# Patient Record
Sex: Female | Born: 1995 | Race: White | Hispanic: No | Marital: Single | Smoking: Never smoker
Health system: Southern US, Community
[De-identification: ages and names within clinical notes are randomized; demographics above are authoritative.]

## PROBLEM LIST (undated history)

## (undated) DIAGNOSIS — B2799 Infectious mononucleosis, unspecified with other complication: Secondary | ICD-10-CM

## (undated) DIAGNOSIS — B178 Other specified acute viral hepatitis: Secondary | ICD-10-CM

## (undated) DIAGNOSIS — B2790 Infectious mononucleosis, unspecified without complication: Secondary | ICD-10-CM

## (undated) DIAGNOSIS — L709 Acne, unspecified: Secondary | ICD-10-CM

## (undated) DIAGNOSIS — R0689 Other abnormalities of breathing: Secondary | ICD-10-CM

## (undated) DIAGNOSIS — J45990 Exercise induced bronchospasm: Secondary | ICD-10-CM

## (undated) HISTORY — PX: MYRINGOTOMY WITH TUBE PLACEMENT: SHX5663

## (undated) HISTORY — DX: Exercise induced bronchospasm: J45.990

## (undated) HISTORY — DX: Infectious mononucleosis, unspecified without complication: B27.90

## (undated) HISTORY — PX: CHOANAL ADENIODECTOMY: SHX1340

## (undated) HISTORY — PX: TONSILLECTOMY: SUR1361

## (undated) HISTORY — DX: Other specified acute viral hepatitis: B17.8

## (undated) HISTORY — DX: Other abnormalities of breathing: R06.89

## (undated) HISTORY — DX: Infectious mononucleosis, unspecified with other complication: B27.99

---
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
## 2017-04-01 DIAGNOSIS — F411 Generalized anxiety disorder: Secondary | ICD-10-CM | POA: Diagnosis not present

## 2017-07-10 ENCOUNTER — Ambulatory Visit (INDEPENDENT_AMBULATORY_CARE_PROVIDER_SITE_OTHER): Payer: BLUE CROSS/BLUE SHIELD | Admitting: Adult Health

## 2017-07-10 ENCOUNTER — Encounter: Payer: Self-pay | Admitting: Adult Health

## 2017-07-10 VITALS — BP 106/72 | Temp 99.0°F | Wt 166.0 lb

## 2017-07-10 DIAGNOSIS — R000 Tachycardia, unspecified: Secondary | ICD-10-CM | POA: Diagnosis not present

## 2017-07-10 DIAGNOSIS — R5383 Other fatigue: Secondary | ICD-10-CM

## 2017-07-10 LAB — BASIC METABOLIC PANEL
BUN: 8 mg/dL (ref 6–23)
CALCIUM: 9.2 mg/dL (ref 8.4–10.5)
CO2: 28 meq/L (ref 19–32)
Chloride: 106 mEq/L (ref 96–112)
Creatinine, Ser: 0.73 mg/dL (ref 0.40–1.20)
GFR: 108.54 mL/min (ref >60.00)
Glucose, Bld: 98 mg/dL (ref 70–99)
Potassium: 4.2 mEq/L (ref 3.5–5.1)
SODIUM: 140 meq/L (ref 135–145)

## 2017-07-10 LAB — CBC WITH DIFFERENTIAL/PLATELET
BASOS ABS: 0 10*3/uL (ref 0.0–0.1)
Basophils Relative: 0.5 % (ref 0.0–3.0)
Eosinophils Absolute: 0 10*3/uL (ref 0.0–0.7)
Eosinophils Relative: 0.7 % (ref 0.0–5.0)
HEMATOCRIT: 41.8 % (ref 36.0–49.0)
HEMOGLOBIN: 14 g/dL (ref 12.0–16.0)
LYMPHS PCT: 34.4 % (ref 24.0–48.0)
Lymphs Abs: 1.8 10*3/uL (ref 0.7–4.0)
MCHC: 33.6 g/dL (ref 31.0–37.0)
MCV: 91.4 fl (ref 78.0–98.0)
MONOS PCT: 6.9 % (ref 3.0–12.0)
Monocytes Absolute: 0.4 10*3/uL (ref 0.1–1.0)
NEUTROS ABS: 3 10*3/uL (ref 1.4–7.7)
Neutrophils Relative %: 57.5 % (ref 43.0–71.0)
Platelets: 187 10*3/uL (ref 150.0–575.0)
RBC: 4.58 Mil/uL (ref 3.80–5.70)
RDW: 13 % (ref 11.4–15.5)
WBC: 5.2 10*3/uL (ref 4.5–13.5)

## 2017-07-10 LAB — IBC PANEL
Iron: 118 ug/dL (ref 42–145)
Saturation Ratios: 29.9 % (ref 20.0–50.0)
TRANSFERRIN: 282 mg/dL (ref 212.0–360.0)

## 2017-07-10 LAB — TSH: TSH: 1.36 u[IU]/mL (ref 0.40–5.00)

## 2017-07-10 NOTE — Patient Instructions (Signed)
It was great meeting you today!  I am going to check blood work to make sure you are not anemic and make sure there is nothing wrong with the thyroid   I will call you on Tuesday when I get the lab work back

## 2017-07-10 NOTE — Progress Notes (Signed)
Subjective:    Patient ID: Stacy Chan, female    DOB: [birth year], 21 y.o.   MRN: [id number]  HPI  21 year old female who  has a past medical history of Acne; Breathing difficult; Exercise-induced asthma; Infectious mononucleosis (12/02/2013); and Infectious mononucleosis hepatitis (12/02/2013).   She presents to the office today with the complaint of worsening fatigue, feeling of having a fast heart rate, and bruising  In mid June she started to notice that she was having episodes of a fast heart rate, she only noticed the fast heart rate when she was relaxing and in the evening. When she is busy carrying out her normal activities she does not notice any tachycardia. Throughout the summer she also noticed that she was more fatigued than she normally is and that she was bruising easier on her legs. The bruising was noticed by her friends which made her concerned.   She has stopped eating red meat   Denies depression, fevers, chest pain, SOB, or feeling ill.   She is leaving to go back to [place] in the morning for college    Review of Systems See HPI   Past Medical History:  Diagnosis Date  . Acne   . Breathing difficult    at birth;9lbs 4oz,21.5in was a csection baby  . Exercise-induced asthma    ?  helped with inhaler  no other sx  by hx  . Infectious mononucleosis 12/02/2013   Worsening mild splenomegaly not unexpected but tender   . Infectious mononucleosis hepatitis 12/02/2013    Social History   Social History  . Marital status: Single    Spouse name: N/A  . Number of children: N/A  . Years of education: N/A   Occupational History  . Not on file.   Social History Main Topics  . Smoking status: Never Smoker  . Smokeless tobacco: Never Used  . Alcohol use No  . Drug use: No  . Sexual activity: No   Other Topics Concern  . Not on file   Social History Narrative   No school concerns   hh [employer] 5    [employer]     Basketball track cross country   [employer].   Mission trip to [place] this summer    [facility] undeclared    Past Surgical History:  Procedure Laterality Date  . CHOANAL ADENIODECTOMY    . MYRINGOTOMY WITH TUBE PLACEMENT    . TONSILLECTOMY      Family History  Problem Relation Age of Onset  . Depression Other        family hx of  . Bipolar disorder Mother   . Anxiety disorder Brother   . ADD / ADHD Brother   . Depression Paternal Aunt   . Depression Paternal Uncle   . Anxiety disorder Maternal Grandfather   . Alcohol abuse Maternal Grandfather   . Depression Paternal Grandfather   . Thyroid disease Sister   . Thyroid disease Mother     No Known Allergies  No current outpatient prescriptions on file prior to visit.   No current facility-administered medications on file prior to visit.     BP 106/72 (BP Location: Left Arm)   Temp 99 F (37.2 C) (Oral)   Wt 166 lb (75.3 kg)   BMI 24.51 kg/m       Objective:   Physical Exam  Constitutional: She is oriented to person, place, and time. She appears well-developed and well-nourished. No  distress.  HENT:  Mouth/Throat: Uvula is midline, oropharynx is clear and moist and mucous membranes are normal. Mucous membranes are not pale, not dry and not cyanotic.  Cardiovascular: Normal rate, regular rhythm, normal heart sounds and intact distal pulses.  Exam reveals no gallop and no friction rub.   No murmur heard. Pulmonary/Chest: Effort normal and breath sounds normal. No respiratory distress. She has no wheezes. She has no rales. She exhibits no tenderness.  Neurological: She is alert and oriented to person, place, and time.  Skin: Skin is warm and dry. No rash noted. She is not diaphoretic. No erythema. No pallor.  Psychiatric: She has a normal mood and affect. Her behavior is normal. Judgment and thought content normal.  Nursing note and vitals reviewed.     Assessment & Plan:  1. Fatigue, unspecified type - Will check thyroid and blood counts for  anemia.  - Basic metabolic panel - CBC with Differential/Platelet - TSH - IBC Panel  2. Tachycardia - Basic metabolic panel - CBC with Differential/Platelet - TSH - IBC Panel  Shirline Frees, NP

## 2017-07-14 ENCOUNTER — Telehealth: Payer: Self-pay | Admitting: Internal Medicine

## 2017-07-14 NOTE — Telephone Encounter (Signed)
Pt would like blood work result  °

## 2017-07-15 NOTE — Telephone Encounter (Signed)
Left a VM for patient to give the office a call back regarding lab results.  

## 2018-04-10 ENCOUNTER — Other Ambulatory Visit: Payer: Self-pay

## 2018-04-10 ENCOUNTER — Ambulatory Visit (INDEPENDENT_AMBULATORY_CARE_PROVIDER_SITE_OTHER): Payer: BLUE CROSS/BLUE SHIELD | Admitting: Family Medicine

## 2018-04-10 ENCOUNTER — Encounter: Payer: Self-pay | Admitting: Family Medicine

## 2018-04-10 VITALS — BP 90/60 | HR 117 | Temp 99.9°F | Resp 14 | Ht 69.0 in | Wt 168.0 lb

## 2018-04-10 DIAGNOSIS — J069 Acute upper respiratory infection, unspecified: Secondary | ICD-10-CM

## 2018-04-10 DIAGNOSIS — J029 Acute pharyngitis, unspecified: Secondary | ICD-10-CM

## 2018-04-10 LAB — POCT RAPID STREP A (OFFICE): RAPID STREP A SCREEN: NEGATIVE

## 2018-04-10 MED ORDER — ONDANSETRON 4 MG PO TBDP: 4.0000 mg | ORAL_TABLET | Freq: Three times a day (TID) | ORAL | 0 refills | Status: AC | PRN

## 2018-04-10 NOTE — Progress Notes (Signed)
  Subjective:     Patient ID: Stacy Chan, female   DOB: Feb 20, 1996, 22 y.o.   MRN: [id number]  HPI   Patient seen with onset of illness about 4 days ago. He's had some sore throat, fever, chills, body aches, mild cough, nasal congestion.. She had one episode of vomiting this morning. No diarrhea. No sick contacts. Took Ibuprofen with mild relief. She's has had past history of mono. No recent tick bites.  Cough has been relatively mild. No dysuria and no abdominal pain.  Past Medical History:  Diagnosis Date  . Acne   . Breathing difficult    at birth;9lbs 4oz,21.5in was a csection baby  . Exercise-induced asthma    ?  helped with inhaler  no other sx  by hx  . Infectious mononucleosis 12/02/2013   Worsening mild splenomegaly not unexpected but tender   . Infectious mononucleosis hepatitis 12/02/2013   Past Surgical History:  Procedure Laterality Date  . CHOANAL ADENIODECTOMY    . MYRINGOTOMY WITH TUBE PLACEMENT    . TONSILLECTOMY      reports that she has never smoked. She has never used smokeless tobacco. She reports that she does not drink alcohol or use drugs. family history includes ADD / ADHD in her brother; Alcohol abuse in her maternal grandfather; Anxiety disorder in her brother and maternal grandfather; Bipolar disorder in her mother; Depression in her other, paternal aunt, paternal grandfather, and paternal uncle; Thyroid disease in her mother and sister. No Known Allergies   Review of Systems  Constitutional: Positive for chills and fatigue.  HENT: Positive for congestion and sore throat.   Respiratory: Positive for cough.   Gastrointestinal: Negative for abdominal pain.  Genitourinary: Negative for dysuria.       Objective:   Physical Exam  Constitutional: She appears well-developed and well-nourished.  HENT:  Right Ear: External ear normal.  Left Ear: External ear normal.  Mild posterior pharynx erythema. No exudate  Cardiovascular: Normal rate and regular  rhythm.  Pulmonary/Chest: Effort normal and breath sounds normal. No respiratory distress. She has no wheezes. She has no rales.  Skin: No rash noted.       Assessment:     Acute upper respiratory illness.  Rapid strep negative.    Plan:     -continue Ibuprofen or Tylenol as needed for fever, sore throat.   -stay well hydrated. -follow up with primary for any worsening or persistent symptoms.  Kristian Covey MD [facility]

## 2018-04-10 NOTE — Patient Instructions (Signed)

## 2018-04-12 ENCOUNTER — Ambulatory Visit (INDEPENDENT_AMBULATORY_CARE_PROVIDER_SITE_OTHER): Payer: BLUE CROSS/BLUE SHIELD | Admitting: Internal Medicine

## 2018-04-12 ENCOUNTER — Encounter: Payer: Self-pay | Admitting: Internal Medicine

## 2018-04-12 VITALS — BP 128/70 | HR 95 | Temp 98.5°F | Wt 166.5 lb

## 2018-04-12 DIAGNOSIS — J029 Acute pharyngitis, unspecified: Secondary | ICD-10-CM | POA: Diagnosis not present

## 2018-04-12 DIAGNOSIS — R509 Fever, unspecified: Secondary | ICD-10-CM | POA: Diagnosis not present

## 2018-04-12 LAB — CBC WITH DIFFERENTIAL/PLATELET
BASOS PCT: 0.2 % (ref 0.0–3.0)
Basophils Absolute: 0 10*3/uL (ref 0.0–0.1)
EOS ABS: 0 10*3/uL (ref 0.0–0.7)
EOS PCT: 0.1 % (ref 0.0–5.0)
HCT: 38.2 % (ref 36.0–46.0)
HEMOGLOBIN: 13.1 g/dL (ref 12.0–15.0)
LYMPHS ABS: 1.2 10*3/uL (ref 0.7–4.0)
Lymphocytes Relative: 23.3 % (ref 12.0–46.0)
MCHC: 34.2 g/dL (ref 30.0–36.0)
MCV: 91 fl (ref 78.0–100.0)
MONO ABS: 0.8 10*3/uL (ref 0.1–1.0)
Monocytes Relative: 16 % — ABNORMAL HIGH (ref 3.0–12.0)
NEUTROS PCT: 60.4 % (ref 43.0–77.0)
Neutro Abs: 3.1 10*3/uL (ref 1.4–7.7)
PLATELETS: 132 10*3/uL — AB (ref 150.0–400.0)
RBC: 4.2 Mil/uL (ref 3.87–5.11)
RDW: 12.9 % (ref 11.5–14.6)
WBC: 5.1 10*3/uL (ref 4.5–10.5)

## 2018-04-12 LAB — POCT RAPID STREP A (OFFICE): RAPID STREP A SCREEN: NEGATIVE

## 2018-04-12 MED ORDER — AMOXICILLIN 500 MG PO CAPS: 500.0000 mg | ORAL_CAPSULE | Freq: Two times a day (BID) | ORAL | 0 refills | Status: AC

## 2018-04-12 MED ORDER — VALACYCLOVIR HCL 1 G PO TABS: 1000.0000 mg | ORAL_TABLET | Freq: Two times a day (BID) | ORAL | 0 refills | Status: AC

## 2018-04-12 NOTE — Patient Instructions (Addendum)
This could be a viral  Infection such as the herpes simplex virus that causes cold sores.    BUtChecking for strep. At this time will treat for both pending    Test results   Force liquids toa void dehydration   Ibuprofen    And Or tylenol for  Pain and fever   Expect fever and improvement in the next 2-3 days  .  conact [place] for rehcek this week if  Not improving

## 2018-04-12 NOTE — Progress Notes (Signed)
Chief Complaint  Patient presents with  . Sore Throat    Pt c/o sore throat x 8 days, sores on throat and roof of mouth, chills, sweats, body aches, headache(worse with activity), fever highest at 102. Pt repors having Nausea and vomiting x 2 days ago. Pt reports noticing rash on hand. Seen by [facility] on 04/10/18. Pt reports feeling congested and coughing up yellow mucous. Pt took  Tylenol this morning at 8am for fever and pain. Some neck stiffness and soreness.    HPI: Stacy Chan 22 y.o. come in for sda  Cold and  Mouth sores  Here with mom  Seems with and alone ponset a week ago of st and low grade temp  And  Seen in Saturday clinic   Rapid strep negativea nad was felt to be viral uri  However last night temp 102.8 and owrsningin st   With ulcers  One of them  [place]?   Had a rash on thumb that is now gone   But see above.   Fever chills sweating  And  Had blood onroof of mouth  And Mucous cough .    Feels like when had strep  excpet has no tonsils  .    Very painful taking tylenol     Had vomiting  X 1   No current rahs had been  On thumb.  No exposures  Noted    She doesn't get cold sores   frind has some  Neg SA exposurse   ROS: See pertinent positives and negatives per HPI. No cp sob  diarrhea  Past Medical History:  Diagnosis Date  . Acne   . Breathing difficult    at birth;9lbs 4oz,21.5in was a csection baby  . Exercise-induced asthma    ?  helped with inhaler  no other sx  by hx  . Infectious mononucleosis 12/02/2013   Worsening mild splenomegaly not unexpected but tender   . Infectious mononucleosis hepatitis 12/02/2013    Family History  Problem Relation Age of Onset  . Depression Other        family hx of  . Bipolar disorder Mother   . Anxiety disorder Brother   . ADD / ADHD Brother   . Depression Paternal Aunt   . Depression Paternal Uncle   . Anxiety disorder Maternal Grandfather   . Alcohol abuse Maternal Grandfather   . Depression Paternal  Grandfather   . Thyroid disease Sister   . Thyroid disease Mother     Social History   Socioeconomic History  . Marital status: Single    Spouse name: Not on file  . Number of children: Not on file  . Years of education: Not on file  . Highest education level: Not on file  Occupational History  . Not on file  Social Needs  . Financial resource strain: Not on file  . Food insecurity:    Worry: Not on file    Inability: Not on file  . Transportation needs:    Medical: Not on file    Non-medical: Not on file  Tobacco Use  . Smoking status: Never Smoker  . Smokeless tobacco: Never Used  Substance and Sexual Activity  . Alcohol use: No    Alcohol/week: 0.0 oz  . Drug use: No  . Sexual activity: Never  Lifestyle  . Physical activity:    Days per week: Not on file    Minutes per session: Not on file  . Stress: Not on file  Relationships  . Social connections:    Talks on phone: Not on file    Gets together: Not on file    Attends religious service: Not on file    Active member of club or organization: Not on file    Attends meetings of clubs or organizations: Not on file    Relationship status: Not on file  Other Topics Concern  . Not on file  Social History Narrative   No school concerns   hh of 5    [employer]     Basketball track cross country   Neg tad ets.   Mission trip to [place] this summer    un[employer] undeclared    Outpatient Medications Prior to Visit  Medication Sig Dispense Refill  . escitalopram (LEXAPRO) 10 MG tablet Take 10 mg by mouth daily.  1  . ondansetron (ZOFRAN ODT) 4 MG disintegrating tablet Take 1 tablet (4 mg total) by mouth every 8 (eight) hours as needed for nausea or vomiting. 15 tablet 0  . propranolol (INDERAL) 20 MG tablet      No facility-administered medications prior to visit.      EXAM:  BP 128/70 (BP Location: Right Arm, Patient Position: Sitting, Cuff Size: Normal)   Pulse 95   Temp 98.5 F (36.9 C)  (Oral)   Wt 166 lb 8 oz (75.5 kg)   SpO2 94%   BMI 24.59 kg/m   Body mass index is 24.59 kg/m.  GENERAL: vitals reviewed and listed above, alert, oriented, appears well hydrated and in no acute distress moderately ill but non t oxic here with mom  HEENT: atraumatic, conjunctiva  clear, no obvious abnormalities on inspection of external nose and ears tm clear speech nl OP : no edema palatal spots poss early ulcer and in post pharyngea area left more than right there is exudate  NECK: 1-2+ tender ac  No pc nodes  LUNGS: clear to auscultation bilaterally, no wheezes, rales or rhonchi, good air movement CV: HRRR, no clubbing cyanosis or  peripheral edema nl cap refill  Abdomen:  Sof,t normal bowel sounds without hepatosplenomegaly, no guarding rebound or masses no CVA tenderness Skin: normal capillary refill ,turgor , color: No acute rashes ,petechiae or bruising MS: moves all extremities without noticeable focal  abnormality : pleasant and cooperative, no obvious depression or anxiety Lab Results  Component Value Date   WBC 5.2 07/10/2017   HGB 14.0 07/10/2017   HCT 41.8 07/10/2017   PLT 187.0 07/10/2017   GLUCOSE 98 07/10/2017   CHOL 134 06/03/2016   TRIG 39.0 06/03/2016   HDL 69.10 06/03/2016   LDLCALC 57 06/03/2016   ALT 14 06/03/2016   AST 20 06/03/2016   NA 140 07/10/2017   K 4.2 07/10/2017   CL 106 07/10/2017   CREATININE 0.73 07/10/2017   BUN 8 07/10/2017   CO2 28 07/10/2017   TSH 1.36 07/10/2017   BP Readings from Last 3 Encounters:  04/12/18 128/70  04/10/18 90/60  07/10/17 106/72    ASSESSMENT AND PLAN:  Discussed the following assessment and plan:  Acute ulcerative pharyngitis - w post phar exudate? - Plan: CBC with Differential/Platelet, POCT rapid strep A, Culture, Group A Strep  Fever, unspecified fever cause - Plan: CBC with Differential/Platelet, POCT rapid strep A, Culture, Group A Strep Acts viral but has had strep  Impressive adenopathy  Anterior  .   Culture   And empiric rx  w amox and  Valtrex..( has  had mono in the past)   Sx rx  gargles and follow  Close fu advised here this week  -Patient advised to return or notify health care team  if  new concerns arise.  Patient Instructions  This could be a viral  Infection such as the herpes simplex virus that causes cold sores.    BUtChecking for strep. At this time will treat for both pending    Test results   Force liquids toa void dehydration   Ibuprofen    And Or tylenol for  Pain and fever   Expect fever and improvement in the next 2-3 days  .  conact [place] for rehcek this week if  Not improving  Neta Mends. Kemoni Quesenberry M.D.

## 2018-04-14 LAB — CULTURE, GROUP A STREP
MICRO NUMBER: 90610563
SPECIMEN QUALITY: ADEQUATE

## 2018-05-03 ENCOUNTER — Encounter: Payer: Self-pay | Admitting: *Deleted

## 2018-05-11 ENCOUNTER — Telehealth: Payer: Self-pay | Admitting: Internal Medicine

## 2018-05-11 NOTE — Telephone Encounter (Unsigned)
Copied from CRM [phone]. Topic: General - Other >> May 11, 2018 10:34 AM Marylen PontoMcneil, Ja-Kwan wrote: Reason for CRM: Pt returned call for lab results. Pt requests a return call. Cb# [phone]

## 2018-05-11 NOTE — Telephone Encounter (Signed)
Pt given results per notes of Dr. Fabian SharpPanosh on 04/13/18. Pt verbalized understanding.Unable to document in result note due to [facility].

## 2018-05-19 DIAGNOSIS — F411 Generalized anxiety disorder: Secondary | ICD-10-CM | POA: Diagnosis not present

## 2018-05-21 DIAGNOSIS — F411 Generalized anxiety disorder: Secondary | ICD-10-CM | POA: Diagnosis not present

## 2019-02-22 ENCOUNTER — Other Ambulatory Visit: Payer: Self-pay

## 2019-02-22 ENCOUNTER — Ambulatory Visit (INDEPENDENT_AMBULATORY_CARE_PROVIDER_SITE_OTHER): Payer: Self-pay | Admitting: Internal Medicine

## 2019-02-22 ENCOUNTER — Ambulatory Visit: Payer: Self-pay | Admitting: Internal Medicine

## 2019-02-22 ENCOUNTER — Encounter: Payer: Self-pay | Admitting: Internal Medicine

## 2019-02-22 DIAGNOSIS — E282 Polycystic ovarian syndrome: Secondary | ICD-10-CM

## 2019-02-22 DIAGNOSIS — R1031 Right lower quadrant pain: Secondary | ICD-10-CM

## 2019-02-22 NOTE — Progress Notes (Signed)
Virtual Visit via Video Note  I connected with@ on 02/22/19 at  3:30 PM EDT by a video enabled telemedicine application and verified that I am speaking with the correct person using two identifiers. Location patient: home Location provider:work or home office Persons participating in the virtual visit: patient, provider  [place]th national recommendations  regarding COVID 19 pandemic   video visit is advised over in office visit for this patient.  With consideration for referral as needed  Discussed the limitations of evaluation and management by telemedicine and  availability of in person appointments. The patient agreed to proceed. Prefers to not come in office    HPI: Stacy Chan Asks for advice visit related to  New sudden  onset right lower abd pain .   She states she had the sudden onset of right lower quadrant pain that was dull and then sharp localized on she believes the evening of March 28 or 29 .  Since that time the pain has waxed and waned hard to describe sometimes sharp and burning sometimes achy but no radiation to the rest of her abdomen down her leg.  No associated fever nausea vomiting change in bowel habits UTI symptoms or hematuria. Denies a vaginal symptoms. She states that her periods are somewhat irregular and has a diagnosis of PCOS and wonders if the pain could be a cyst   Related . Pain at maximum was 6/10 now it is about a 4/10.  It does not keep her from walking. She states her last menstrual period was 318 lasted 7 days pretty normal for her.  They can be a regular every 1 to 3 months. Denies any risk of pregnancy or STI after discussion of differential diagnosis.  She has not had this pain before. Her last GYN check was about 2 years ago.  Contacted pt regarding her symptoms; she says that she has been having lower right abdominal pain which is intermittent, starting the night of 02/19/2019 ; the pt describes the pain as dull; she also says that the area is  somewhat tender to touch; nurse triage initiated and recommendations made per protocol; explained to pt that she may be offered a virtual visit; she agrees; email [email or web address] and [phone]; a message can be left on voicemail; the pt normally sees Dr Fabian Sharp, [facility]; will route to office for notification; also notified Ashtyn at [facility], and she states that she will contact the pt.  ROS: See pertinent positives and negatives per HPI. As above no trauma  abd was swelling or problem   Past Medical History:  Diagnosis Date  . Acne   . Breathing difficult    at birth;9lbs 4oz,21.5in was a csection baby  . Exercise-induced asthma    ?  helped with inhaler  no other sx  by hx  . Infectious mononucleosis 12/02/2013   Worsening mild splenomegaly not unexpected but tender   . Infectious mononucleosis hepatitis 12/02/2013    Past Surgical History:  Procedure Laterality Date  . CHOANAL ADENIODECTOMY    . MYRINGOTOMY WITH TUBE PLACEMENT    . TONSILLECTOMY      Family History  Problem Relation Age of Onset  . Depression Other        family hx of  . Bipolar disorder Mother   . Anxiety disorder Brother   . ADD / ADHD Brother   . Depression Paternal Aunt   . Depression Paternal Uncle   . Anxiety disorder Maternal Grandfather   . Alcohol  abuse Maternal Grandfather   . Depression Paternal Grandfather   . Thyroid disease Sister   . Thyroid disease Mother     SOCIAL HX:  At home  No tobacco   Current Outpatient Medications:  .  amoxicillin (AMOXIL) 500 MG capsule, Take 1 capsule (500 mg total) by mouth 2 (two) times daily., Disp: 20 capsule, Rfl: 0 .  escitalopram (LEXAPRO) 10 MG tablet, Take 10 mg by mouth daily., Disp: , Rfl: 1 .  ondansetron (ZOFRAN ODT) 4 MG disintegrating tablet, Take 1 tablet (4 mg total) by mouth every 8 (eight) hours as needed for nausea or vomiting., Disp: 15 tablet, Rfl: 0 .  propranolol (INDERAL) 20 MG tablet, , Disp: , Rfl:  .  valACYclovir  (VALTREX) 1000 MG tablet, Take 1 tablet (1,000 mg total) by mouth 2 (two) times daily., Disp: 20 tablet, Rfl: 0  EXAM:  VITALS per patient if applicable:  GENERAL: alert, oriented, appears well and in no acute distress  HEENT: atraumatic, conjunttiva clear, no obvious abnormalities on inspection of external nose and ears  NECK: normal movements of the head and neck LUNGS: on inspection no signs of respiratory distress, breathing rate appears normal, no obvious gross SOB, gasping or wheezing CV: no obvious cyanosis abd on standing  location of discomfort  tenderness is  rlq pelvic  No hernia noted  And able to  Compress without   Guarding and no rebound on self exam  MS: moves all visible extremities without noticeable abnormality able to walk without obv disability   PSYCH/NEURO: pleasant and cooperative, no obvious depression or anxiety, speech and thought processing grossly intact  ASSESSMENT AND PLAN:  Discussed the following assessment and plan:  RLQ abdominal pain - supsect ovulatory  pain disc diff dx  and  fu  nsiad and rest  and touch base in next 2-e3 days  consider see gyne etc   PCOS (polycystic ovarian syndrome) Disc  diff dx   This is most likely ovulatory pain with or without cyst   Low to zero risk of infection sti and preg by hx  .   Less likely appendicitis  Based on context  And presentation   Shared Decision Making at this time   [facility] to not do the otc preg and urine dipstick since low risk . Observe and use ibuprofen 800 every 8 hours as  Needed . And  Close observation    Expectant management   That should improve  In next 2 days ors o    Check in with [place] before the weekend in a few day  as about how doing     If  persistent or progressive  Advise see her contacdt her gyne     Alarm sx  About when to see more emergent urg care .  and discussion of plan and treatment with patient with opportunity to ask questions were answered. The patient agreed with the plan   pt  could consider  poc preg and ua otc but not felt to be a   Yielding  Procedure  The patient was advised to call back or seek an in-person evaluation if worsening having concerns    or if the condition fails to improve as anticipated. And also check in before weekend.    Berniece Andreas, MD

## 2019-02-22 NOTE — Telephone Encounter (Signed)
Contacted pt regarding her symptoms; she says that she has been having lower right abdominal pain which is intermittent, starting the night of 02/19/2019 ; the pt describes the pain as dull; she also says that the area is somewhat tender to touch; nurse triage initiated and recommendations made per protocol; explained to pt that she may be offered a virtual visit; she agrees; email [email or web address] and [phone]; a message can be left on voicemail; the pt normally sees Dr Fabian Sharp, [facility]; will route to office for notification; also notified Ashtyn at [facility], and she states that she will contact the pt.  Reason for Disposition . [1] MILD-MODERATE pain AND [2] constant AND [3] present > 2 hours  Answer Assessment - Initial Assessment Questions 1. LOCATION: "Where does it hurt?"      Right lower abdomen 2. RADIATION: "Does the pain shoot anywhere else?" (e.g., chest, back)     no 3. ONSET: "When did the pain begin?" (e.g., minutes, hours or days ago)      02/19/2019 4. SUDDEN: "Gradual or sudden onset?"     sudden 5. PATTERN "Does the pain come and go, or is it constant?"    - If constant: "Is it getting better, staying the same, or worsening?"      (Note: Constant means the pain never goes away completely; most serious pain is constant and it progresses)     - If intermittent: "How long does it last?" "Do you have pain now?"     (Note: Intermittent means the pain goes away completely between bouts)     Intermittent; worsens sometimes with movement; pt says 1 year ago she was diagnosed with PCOS and she wonders if it can be an ovarian cyst 6. SEVERITY: "How bad is the pain?"  (e.g., Scale 1-10; mild, moderate, or severe)   - MILD (1-3): doesn't interfere with normal activities, abdomen soft and not tender to touch    - MODERATE (4-7): interferes with normal activities or awakens from sleep, tender to touch    - SEVERE (8-10): excruciating pain, doubled over, unable to do any  normal activities      0-5 out of 10 at its worse 7. RECURRENT SYMPTOM: "Have you ever had this type of abdominal pain before?" If so, ask: "When was the last time?" and "What happened that time?"      no 8. CAUSE: "What do you think is causing the abdominal pain?"    PCOS and she wonders if it can be an ovarian cyst 9. RELIEVING/AGGRAVATING FACTORS: "What makes it better or worse?" (e.g., movement, antacids, bowel movement)    Nothing makes better or worse 10. OTHER SYMPTOMS: "Has there been any vomiting, diarrhea, constipation, or urine problems?"       no 11. PREGNANCY: "Is there any chance you are pregnant?" "When was your last menstrual period?"      No LMP 02/09/2019  Protocols used: ABDOMINAL PAIN - [facility]

## 2019-02-25 ENCOUNTER — Telehealth: Payer: Self-pay | Admitting: *Deleted

## 2019-02-25 NOTE — Telephone Encounter (Signed)
Clinic RN attempted to call RN. Patient did not answer. LVM for patient to return call. CRM created.

## 2019-03-09 DIAGNOSIS — F411 Generalized anxiety disorder: Secondary | ICD-10-CM | POA: Diagnosis not present

## 2019-04-13 DIAGNOSIS — F411 Generalized anxiety disorder: Secondary | ICD-10-CM | POA: Diagnosis not present

## 2019-05-11 DIAGNOSIS — F411 Generalized anxiety disorder: Secondary | ICD-10-CM | POA: Diagnosis not present

## 2019-06-30 DIAGNOSIS — F411 Generalized anxiety disorder: Secondary | ICD-10-CM | POA: Diagnosis not present

## 2019-07-06 ENCOUNTER — Other Ambulatory Visit: Payer: Self-pay

## 2019-07-06 DIAGNOSIS — Z20822 Contact with and (suspected) exposure to covid-19: Secondary | ICD-10-CM

## 2019-07-07 LAB — NOVEL CORONAVIRUS, NAA: SARS-CoV-2, NAA: NOT DETECTED

## 2019-08-24 DIAGNOSIS — F411 Generalized anxiety disorder: Secondary | ICD-10-CM | POA: Diagnosis not present

## 2019-10-18 DIAGNOSIS — F411 Generalized anxiety disorder: Secondary | ICD-10-CM | POA: Diagnosis not present

## 2019-11-09 ENCOUNTER — Other Ambulatory Visit: Payer: Self-pay

## 2019-12-08 DIAGNOSIS — F411 Generalized anxiety disorder: Secondary | ICD-10-CM | POA: Diagnosis not present

## 2020-03-28 DIAGNOSIS — F411 Generalized anxiety disorder: Secondary | ICD-10-CM | POA: Diagnosis not present

## 2020-03-28 DIAGNOSIS — F909 Attention-deficit hyperactivity disorder, unspecified type: Secondary | ICD-10-CM | POA: Diagnosis not present

## 2020-04-30 ENCOUNTER — Ambulatory Visit: Payer: Self-pay

## 2020-04-30 ENCOUNTER — Ambulatory Visit: Payer: Self-pay | Attending: Internal Medicine

## 2020-04-30 DIAGNOSIS — Z23 Encounter for immunization: Secondary | ICD-10-CM

## 2020-04-30 NOTE — Progress Notes (Signed)
   Covid-19 Vaccination Clinic  Name:  Stacy Chan    MRN: [id number] DOB: March 13, 1996  04/30/2020  Stacy Chan was observed post Covid-19 immunization for 15 minutes without incident. She was provided with Vaccine Information Sheet and instruction to access the V-Safe system.   Stacy Chan was instructed to call 911 with any severe reactions post vaccine: Marland Kitchen Difficulty breathing  . Swelling of face and throat  . A fast heartbeat  . A bad rash all over body  . Dizziness and weakness   Immunizations Administered    Name Date Dose VIS Date Route   Pfizer COVID-19 Vaccine 04/30/2020 11:52 AM 0.3 mL 01/18/2019 Intramuscular   Manufacturer: [employer], [employer]   Lot: [id number]   NDC: [address]-2

## 2020-05-21 ENCOUNTER — Ambulatory Visit: Payer: Self-pay | Attending: Internal Medicine

## 2020-07-17 DIAGNOSIS — F909 Attention-deficit hyperactivity disorder, unspecified type: Secondary | ICD-10-CM | POA: Diagnosis not present

## 2020-07-17 DIAGNOSIS — F411 Generalized anxiety disorder: Secondary | ICD-10-CM | POA: Diagnosis not present
